# Patient Record
Sex: Male | Born: 1995 | Race: White | Hispanic: No | Marital: Single | Smoking: Never smoker
Health system: Southern US, Community
[De-identification: ages and names within clinical notes are randomized; demographics above are authoritative.]

## PROBLEM LIST (undated history)

## (undated) DIAGNOSIS — F32A Depression, unspecified: Secondary | ICD-10-CM

## (undated) DIAGNOSIS — F419 Anxiety disorder, unspecified: Secondary | ICD-10-CM

## (undated) DIAGNOSIS — F329 Major depressive disorder, single episode, unspecified: Secondary | ICD-10-CM

## (undated) HISTORY — DX: Depression, unspecified: F32.A

## (undated) HISTORY — DX: Anxiety disorder, unspecified: F41.9

---
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
## 2019-01-23 ENCOUNTER — Ambulatory Visit: Payer: Self-pay | Admitting: Family Medicine

## 2019-02-06 ENCOUNTER — Ambulatory Visit: Payer: Self-pay | Admitting: Family Medicine

## 2019-05-11 ENCOUNTER — Other Ambulatory Visit (HOSPITAL_COMMUNITY)
Admission: RE | Admit: 2019-05-11 | Discharge: 2019-05-11 | Disposition: A | Discharge status: Home / Self Care | Payer: Managed Care, Other (non HMO) | Source: Ambulatory Visit | Attending: Family Medicine | Admitting: Family Medicine

## 2019-05-11 ENCOUNTER — Ambulatory Visit (INDEPENDENT_AMBULATORY_CARE_PROVIDER_SITE_OTHER): Payer: Managed Care, Other (non HMO)

## 2019-05-11 ENCOUNTER — Other Ambulatory Visit: Payer: Self-pay

## 2019-05-11 ENCOUNTER — Ambulatory Visit (INDEPENDENT_AMBULATORY_CARE_PROVIDER_SITE_OTHER): Payer: Managed Care, Other (non HMO) | Admitting: Family Medicine

## 2019-05-11 ENCOUNTER — Encounter: Payer: Self-pay | Admitting: Family Medicine

## 2019-05-11 VITALS — BP 119/77 | HR 85 | Temp 98.4°F | Wt 108.6 lb

## 2019-05-11 DIAGNOSIS — Z113 Encounter for screening for infections with a predominantly sexual mode of transmission: Secondary | ICD-10-CM | POA: Diagnosis present

## 2019-05-11 DIAGNOSIS — M25512 Pain in left shoulder: Secondary | ICD-10-CM | POA: Diagnosis not present

## 2019-05-11 DIAGNOSIS — R0789 Other chest pain: Secondary | ICD-10-CM | POA: Diagnosis not present

## 2019-05-11 NOTE — Progress Notes (Signed)
1/4/20219:51 AM  Dylan Briggs [birth year], 24 y.o., male [id number]  Chief Complaint  Patient presents with  . 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 Patient stated he has a hx of anxiety and depression but do not take any meds for this at this time.[id number]    HPI:   Patient is a 24 y.o. 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 body rotation Reports that it feels that at times it is harder to take deep full breathes Denies any palpitations, nausea, vomiting, abd pain Reports weight is stable  He denies STD sx, would like to be screened  Depression screen [facility] 2/9 05/11/2019  Decreased Interest 0  Down, Depressed, Hopeless 3  PHQ - 2 Score 3  Altered sleeping 0  Tired, decreased energy 2  Change in appetite 1  Feeling bad or failure about yourself  0  Trouble concentrating 1  Moving slowly or fidgety/restless 1  Suicidal thoughts 0  PHQ-9 Score 8  Difficult doing work/chores Not difficult at all   GAD 7 : Generalized Anxiety Score 05/11/2019  Nervous, Anxious, on Edge 2  Control/stop worrying 2  Worry too much - different things 2  Trouble relaxing 1  Restless 2  Easily annoyed or irritable 1  Afraid - awful might happen 2  Total GAD 7 Score 12     Fall Risk  05/11/2019  Falls in the past year? 0  Number falls in past yr: 1  Injury with Fall? 0  Follow up Falls evaluation completed     No  Known Allergies  Prior to Admission medications   Not on File    Past Medical History:  Diagnosis Date  . Anxiety   . Depression     History reviewed. No pertinent surgical history.  Social History   Tobacco Use  . Smoking status: Never Smoker  . Smokeless tobacco: Never Used  Substance Use Topics  . Alcohol use: Not Currently    Family History  Problem Relation Age of Onset  . Healthy Mother   . Hypertension Father     ROS Per hpi  OBJECTIVE:  Today's Vitals   05/11/19 0936  BP: 119/77  Pulse: 85  Temp: 98.4 F (36.9 C)  TempSrc: Temporal  SpO2: 100%  Weight: 108 lb 9.6 oz (49.3 kg)   There is no height or weight on file to calculate BMI.   Physical Exam Vitals and nursing note reviewed.  Constitutional:      Appearance: He is well-developed.  HENT:     Head: Normocephalic and atraumatic.  Eyes:     Conjunctiva/sclera: Conjunctivae normal.     Pupils: Pupils are equal, round, and reactive to light.  Cardiovascular:     Rate and Rhythm: Normal rate and regular rhythm.     Heart sounds: No murmur. No friction rub. No gallop.   Pulmonary:     Effort: Pulmonary effort is normal.  Breath sounds: Normal breath sounds. No wheezing, rhonchi or rales.  Chest:     Chest wall: No deformity, tenderness or crepitus.  Musculoskeletal:     Cervical back: Neck supple.     Right lower leg: No edema.     Left lower leg: No edema.  Skin:    General: Skin is warm and dry.  Neurological:     Mental Status: He is alert and oriented to person, place, and time.     No results found for this or any previous visit (from the past 24 hour(s)).  DG Sternum  Result Date: 05/11/2019 CLINICAL DATA:  Sternal pain of unknown origin.  No known injury. EXAM: STERNUM - 2+ VIEW COMPARISON:  None. FINDINGS: There is no evidence of fracture or other focal bone lesions. IMPRESSION: Normal exam. Electronically Signed   By: Inge Rise M.D.   On: 05/11/2019 10:17   DG  Clavicle Left  Result Date: 05/11/2019 CLINICAL DATA:  24 year old male with pain no history of trauma EXAM: LEFT CLAVICLE - 2+ VIEWS COMPARISON:  None. FINDINGS: There is no evidence of fracture or other focal bone lesions. Soft tissues are unremarkable. IMPRESSION: Negative. Electronically Signed   By: Corrie Mckusick D.O.   On: 05/11/2019 10:16     ASSESSMENT and PLAN  1. Screen for STD (sexually transmitted disease) - HIV antibody - RPR - Urine cytology ancillary only  2. Arthralgia of left acromioclavicular joint 3. Sternum pain No obvious deformities on exam or xray. Discussed supportive measures. PT for strength and strengthening given repetitive upper body movements. - DG Clavicle Left; Future - DG Sternum; Future - Ambulatory referral to Physical Therapy  Return if symptoms worsen or fail to improve.    Rutherford Guys, MD [facility] at [place], [place] [address] Ph.  [phone] Fax [phone]

## 2019-05-11 NOTE — Patient Instructions (Signed)
° ° ° °  If you have lab work done today you will be contacted with your lab results within the next 2 weeks.  If you have not heard from us then please contact us. The fastest way to get your results is to register for My Chart. ° ° °IF you received an x-ray today, you will receive an invoice from [facility]. Please contact [facility] at [phone] with questions or concerns regarding your invoice.  ° °IF you received labwork today, you will receive an invoice from [facility]. Please contact [facility] at [phone] with questions or concerns regarding your invoice.  ° °Our billing staff will not be able to assist you with questions regarding bills from these companies. ° °You will be contacted with the lab results as soon as they are available. The fastest way to get your results is to activate your My Chart account. Instructions are located on the last page of this paperwork. If you have not heard from us regarding the results in 2 weeks, please contact this office. °  ° ° ° °

## 2019-05-12 ENCOUNTER — Encounter: Payer: Self-pay | Admitting: Family Medicine

## 2019-05-12 LAB — URINE CYTOLOGY ANCILLARY ONLY
Chlamydia: NEGATIVE
Comment: NEGATIVE
Comment: NEGATIVE
Comment: NORMAL
Neisseria Gonorrhea: NEGATIVE
Trichomonas: NEGATIVE

## 2019-05-12 LAB — RPR: RPR Ser Ql: NONREACTIVE

## 2019-05-12 LAB — HIV ANTIBODY (ROUTINE TESTING W REFLEX): HIV Screen 4th Generation wRfx: NONREACTIVE

## 2019-05-26 ENCOUNTER — Ambulatory Visit: Payer: Managed Care, Other (non HMO) | Admitting: Physical Therapy

## 2019-08-13 ENCOUNTER — Ambulatory Visit: Payer: Managed Care, Other (non HMO) | Attending: Internal Medicine

## 2019-08-13 DIAGNOSIS — Z23 Encounter for immunization: Secondary | ICD-10-CM

## 2019-08-13 NOTE — Progress Notes (Signed)
   Covid-19 Vaccination Clinic  Name:  Dylan Briggs    MRN: [id number] DOB: 1995-08-26  08/13/2019  Mr. [name] was observed post Covid-19 immunization for 15 minutes without incident. He was provided with Vaccine Information Sheet and instruction to access the V-Safe system.   Mr. [name] was instructed to call 911 with any severe reactions post vaccine: Marland Kitchen Difficulty breathing  . Swelling of face and throat  . A fast heartbeat  . A bad rash all over body  . Dizziness and weakness   Immunizations Administered    Name Date Dose VIS Date Route   Pfizer COVID-19 Vaccine 08/13/2019  9:38 AM 0.3 mL 04/17/2019 Intramuscular   Manufacturer: [place]   Lot: [id number]   [place]: [id number]

## 2019-08-15 ENCOUNTER — Ambulatory Visit (INDEPENDENT_AMBULATORY_CARE_PROVIDER_SITE_OTHER)
Admission: RE | Admit: 2019-08-15 | Discharge: 2019-08-15 | Disposition: A | Discharge status: Home / Self Care | Payer: Managed Care, Other (non HMO) | Source: Ambulatory Visit

## 2019-08-15 DIAGNOSIS — L299 Pruritus, unspecified: Secondary | ICD-10-CM | POA: Diagnosis not present

## 2019-08-15 DIAGNOSIS — D229 Melanocytic nevi, unspecified: Secondary | ICD-10-CM

## 2019-08-15 MED ORDER — TRIAMCINOLONE ACETONIDE 0.5 % EX OINT: 1.0000 "application " | TOPICAL_OINTMENT | Freq: Two times a day (BID) | CUTANEOUS | 0 refills | Status: AC

## 2019-08-15 NOTE — ED Provider Notes (Signed)
Virtual Visit via Video Note:  Dylan Briggs  initiated request for Telemedicine visit with [facility] team. I connected with Dylan Briggs  on 08/15/2019 at 9:11 AM  for a synchronized telemedicine visit using a video enabled HIPPA compliant telemedicine application. I verified that I am speaking with Dylan Briggs  using two identifiers. Dylan Briggs, Dylan Briggs  was physically located in a [place] [facility] site and Dylan Briggs was located at a different location.   The limitations of evaluation and management by telemedicine as well as the availability of in-person appointments were discussed. Patient was informed that he  may incur a bill ( including co-pay) for this virtual visit encounter. Dylan Briggs  expressed understanding and gave verbal consent to proceed with virtual visit.     History of Present Illness:Dylan Briggs  is a 24 y.o. male presents with concern of mole on the back of his right calf.  Patient states has been there for a long time, though has been itching for the last 3 weeks.  Developed white spots yesterday.  No openings, discharge, pain, redness.  Patient does endorse subjective fever, chills, aches, though got Covid vaccine Thursday.  Not tried thing for this.  Denies personal or family history of skin malignancy.     ROI as per HPI  Past Medical History:  Diagnosis Date  . Anxiety   . Depression     No Known Allergies      Observations/Objective: 24 year old male Sitting in no acute distress.  Patient is able to speak in full sentences without coughing, sneezing, wheezing.  Patient able to show lesion: Approximately 1 cm nevi with few flat, white spots.  Overall poorly visualized due to camera quality.  Patient denies tenderness.  No appreciable erythema, streaking, discharge.  Assessment and Plan: We will trial triamcinolone for pruritus, patient follow-up with dermatology for further evaluation.  Return precautions discussed, patient  verbalized understanding and is agreeable to plan.  Follow Up Instructions: Patient to follow-up with dermatology.   I discussed the assessment and treatment plan with the patient. The patient was provided an opportunity to ask questions and all were answered. The patient agreed with the plan and demonstrated an understanding of the instructions.   The patient was advised to call back or seek an in-person evaluation if the symptoms worsen or if the condition fails to improve as anticipated.  I provided 10 minutes of non-face-to-face time during this encounter.    [place], Dylan Briggs  08/15/2019 9:11 AM        Briggs, Dylan Briggs, Dylan Briggs 08/15/19 [phone]

## 2019-08-15 NOTE — Discharge Instructions (Addendum)
Apply triamcinolone twice daily x1 week. Important to follow-up with dermatology for further evaluation. Seek in-person evaluation sooner if you develop redness, pain, discharge.

## 2019-09-01 ENCOUNTER — Ambulatory Visit: Payer: Managed Care, Other (non HMO) | Admitting: Family Medicine

## 2019-09-07 ENCOUNTER — Ambulatory Visit: Payer: Managed Care, Other (non HMO) | Attending: Internal Medicine

## 2019-09-07 DIAGNOSIS — Z23 Encounter for immunization: Secondary | ICD-10-CM

## 2019-09-07 NOTE — Progress Notes (Signed)
   Covid-19 Vaccination Clinic  Name:  Dylan Briggs    MRN: [id number] DOB: 1995/06/01  09/07/2019  Dylan Briggs was observed post Covid-19 immunization for 15 minutes without incident. He was provided with Vaccine Information Sheet and instruction to access the V-Safe system.   Dylan Briggs was instructed to call 911 with any severe reactions post vaccine: Marland Kitchen Difficulty breathing  . Swelling of face and throat  . A fast heartbeat  . A bad rash all over body  . Dizziness and weakness   Immunizations Administered    Name Date Dose VIS Date Route   Pfizer COVID-19 Vaccine 09/07/2019  9:38 AM 0.3 mL 07/01/2018 Intramuscular   Manufacturer: [place]   Lot: [id number]   [place]: [id number]

## 2019-10-14 ENCOUNTER — Other Ambulatory Visit: Payer: Self-pay

## 2019-10-14 ENCOUNTER — Ambulatory Visit (HOSPITAL_COMMUNITY)
Admission: EM | Admit: 2019-10-14 | Discharge: 2019-10-14 | Disposition: A | Discharge status: Home / Self Care | Payer: Managed Care, Other (non HMO)

## 2020-10-19 IMAGING — DX DG STERNUM 2+V
2 series · 2 of 2 positions shown · non-contrast
Comparison: None.

CLINICAL DATA: Sternal pain of unknown origin.  No known injury.

EXAM:
STERNUM - 2+ VIEW

[sternum obl]
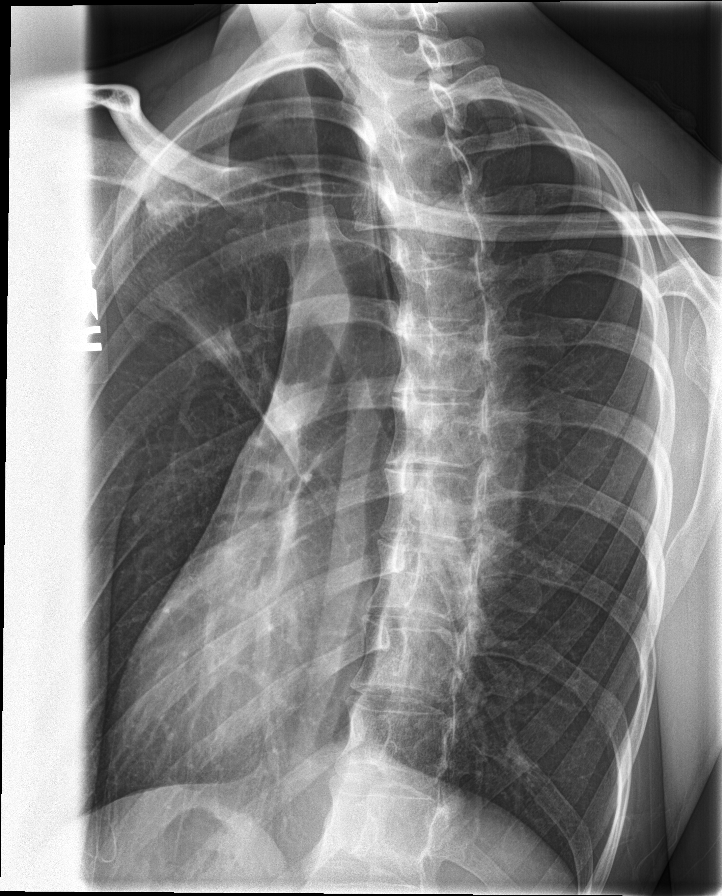

[chest lat]
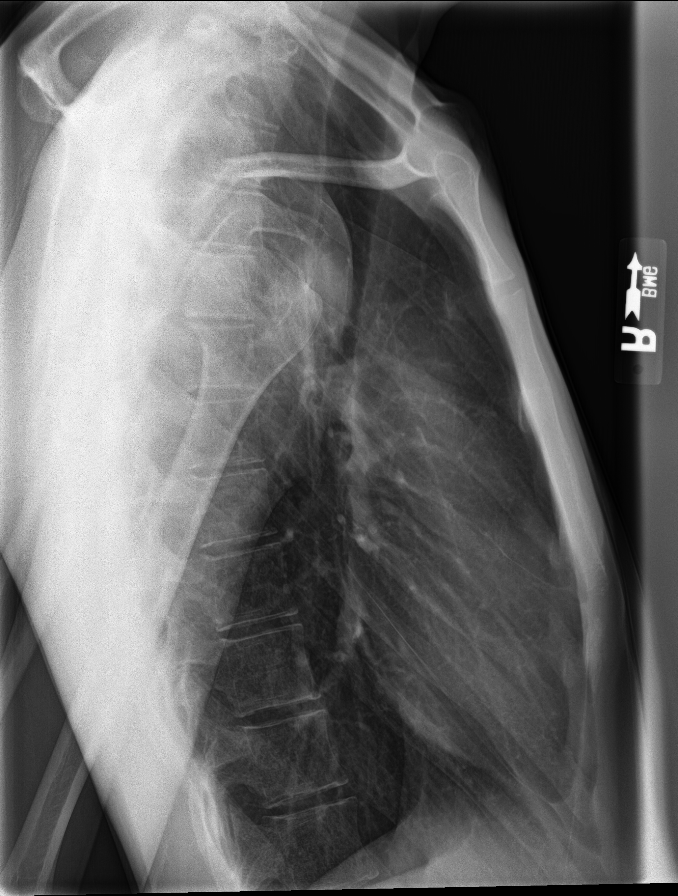

[2 of 2 positions shown; findings below may reference images not displayed]

FINDINGS: There is no evidence of fracture or other focal bone lesions.
IMPRESSION: Normal exam.

## 2020-10-19 IMAGING — DX DG CLAVICLE*L*
2 series · 2 of 2 positions shown · non-contrast
Comparison: None.

CLINICAL DATA: 23-year-old male with pain no history of trauma

EXAM:
LEFT CLAVICLE - 2+ VIEWS

[clavicle ap]
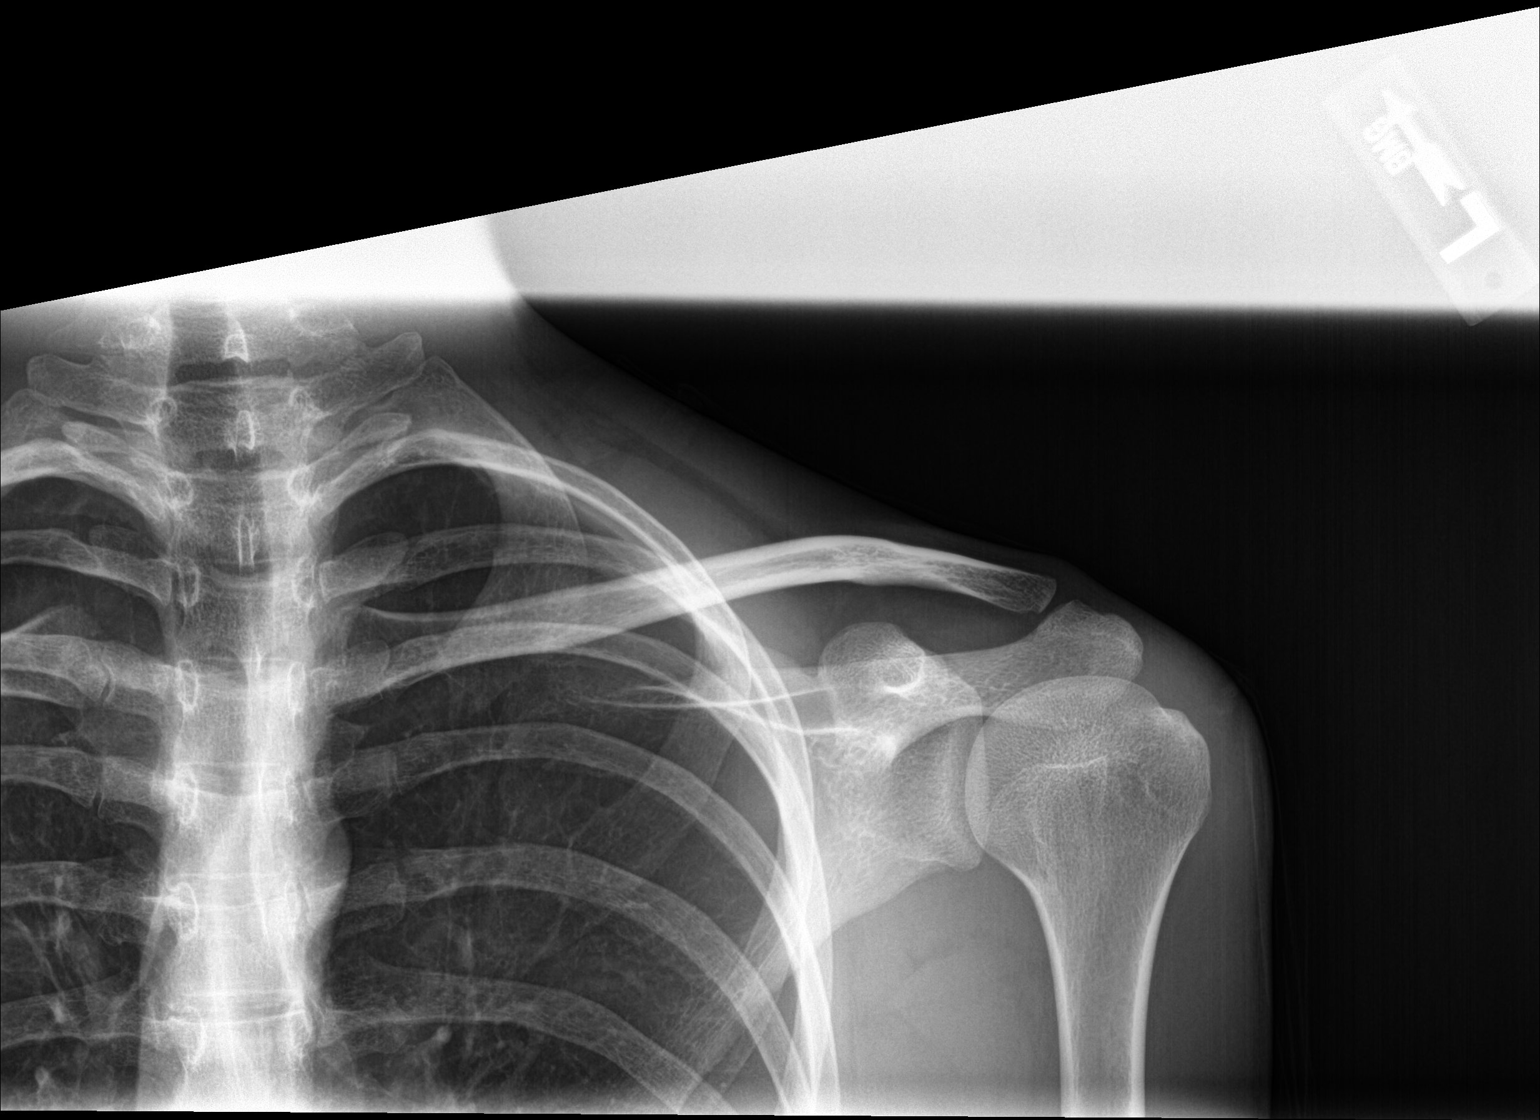

[clavicle axial]
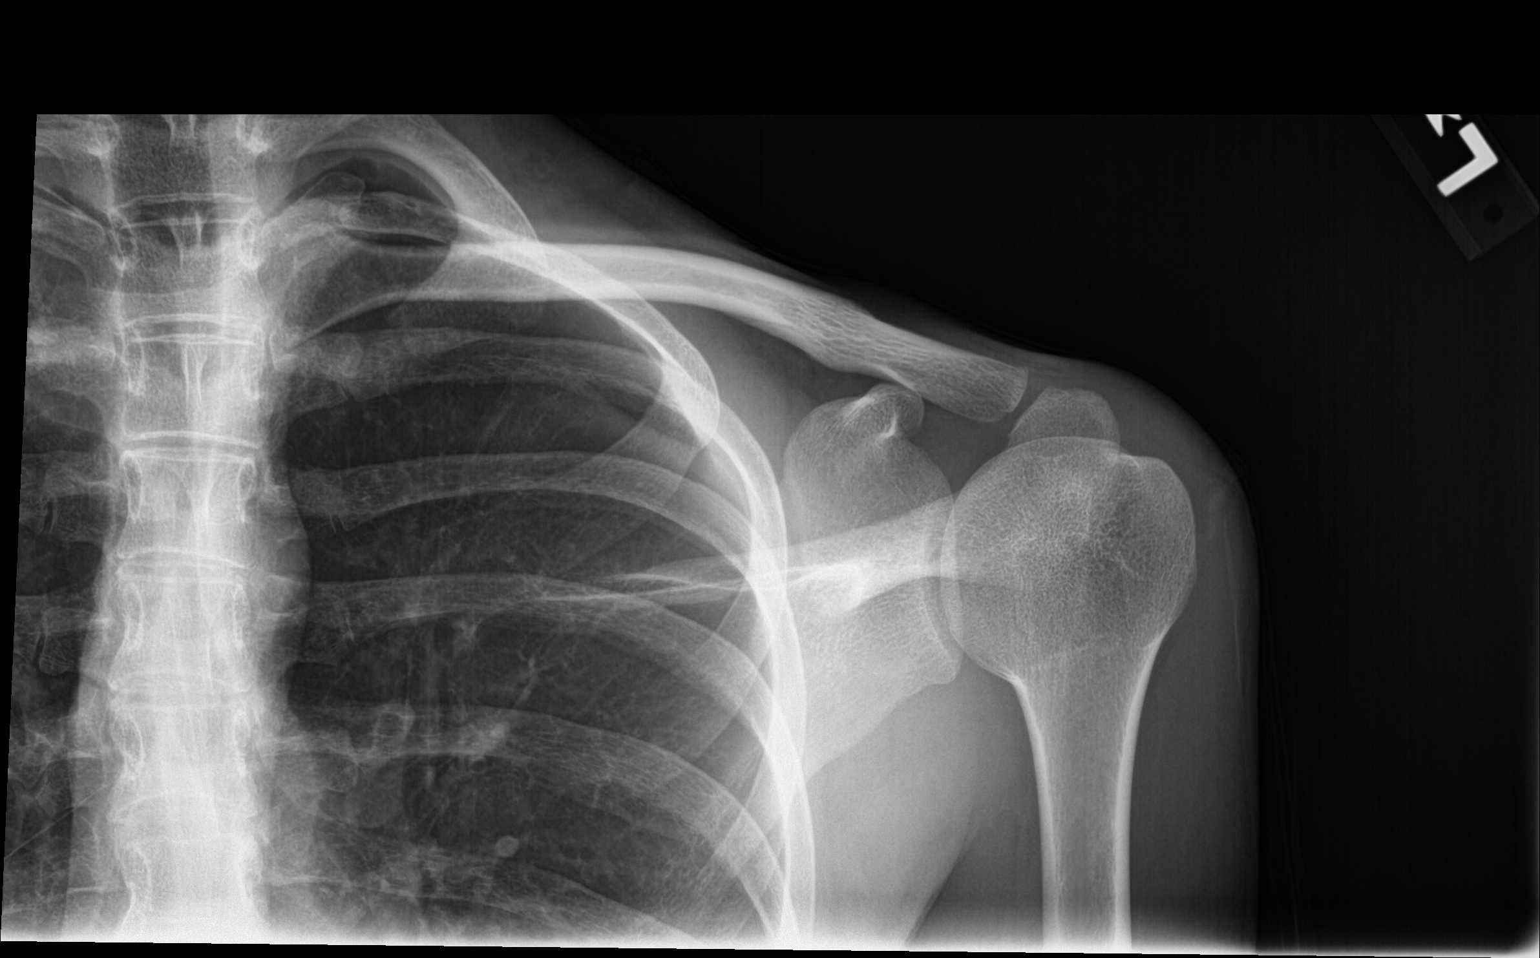

[2 of 2 positions shown; findings below may reference images not displayed]

FINDINGS: There is no evidence of fracture or other focal bone lesions. Soft
tissues are unremarkable.
IMPRESSION: Negative.

## 2021-03-31 ENCOUNTER — Other Ambulatory Visit: Payer: Self-pay

## 2021-03-31 ENCOUNTER — Ambulatory Visit
Admission: EM | Admit: 2021-03-31 | Discharge: 2021-03-31 | Disposition: A | Discharge status: Home / Self Care | Payer: Managed Care, Other (non HMO) | Attending: Physician Assistant | Admitting: Physician Assistant

## 2021-03-31 ENCOUNTER — Encounter: Payer: Self-pay | Admitting: Emergency Medicine

## 2021-03-31 DIAGNOSIS — J101 Influenza due to other identified influenza virus with other respiratory manifestations: Secondary | ICD-10-CM | POA: Diagnosis not present

## 2021-03-31 LAB — POCT INFLUENZA A/B
Influenza A, POC: POSITIVE — AB
Influenza B, POC: NEGATIVE

## 2021-03-31 MED ORDER — OSELTAMIVIR PHOSPHATE 75 MG PO CAPS: 75.0000 mg | ORAL_CAPSULE | Freq: Two times a day (BID) | ORAL | 0 refills | Status: AC

## 2021-03-31 NOTE — ED Triage Notes (Signed)
Sunday began intermittent fever, then developed body aches, nausea, cough, nasal congestion

## 2021-03-31 NOTE — ED Provider Notes (Signed)
[facility]    CSN: [id number] Arrival date & time: 03/31/21  0825      History   Chief Complaint Chief Complaint  Patient presents with   Fever   Cough    HPI Dylan Briggs is a 25 y.o. male.   Patient here today for evaluation of fever, body aches, nausea, nasal congestion and cough that started about 4 days ago.  He reports that he has had some sore throat.  He does not report vomiting or diarrhea.  He has tried over-the-counter treatment with mild relief.  The history is provided by the patient.  Fever Associated symptoms: cough, myalgias, nausea and sore throat   Associated symptoms: no diarrhea and no vomiting   Cough Associated symptoms: fever, myalgias and sore throat   Associated symptoms: no shortness of breath    Past Medical History:  Diagnosis Date   Anxiety    Depression     There are no problems to display for this patient.   History reviewed. No pertinent surgical history.     Home Medications    Prior to Admission medications   Medication Sig Start Date End Date Taking? Authorizing Provider  oseltamivir (TAMIFLU) 75 MG capsule Take 1 capsule (75 mg total) by mouth every 12 (twelve) hours. 03/31/21  Yes Francene Finders, PA-C  triamcinolone ointment (KENALOG) 0.5 % Apply 1 application topically 2 (two) times daily. 08/15/19   Hall-Potvin, [name], PA-C    Family History Family History  Problem Relation Age of Onset   Healthy Mother    Hypertension Father     Social History Social History   Tobacco Use   Smoking status: Never   Smokeless tobacco: Never  Substance Use Topics   Alcohol use: Not Currently   Drug use: Not Currently    Types: Marijuana     Allergies   Patient has no known allergies.   Review of Systems Review of Systems  Constitutional:  Positive for fever.  HENT:  Positive for sore throat.   Respiratory:  Positive for cough. Negative for shortness of breath.   Gastrointestinal:  Positive for  nausea. Negative for diarrhea and vomiting.  Musculoskeletal:  Positive for myalgias.    Physical Exam Triage Vital Signs ED Triage Vitals  Enc Vitals Group     BP 03/31/21 0935 131/79     Pulse Rate 03/31/21 0935 (!) 107     Resp 03/31/21 0935 16     Temp 03/31/21 0935 98.8 F (37.1 C)     Temp Source 03/31/21 0935 Oral     SpO2 03/31/21 0935 98 %     Weight --      Height --      Head Circumference --      Peak Flow --      Pain Score 03/31/21 0936 5     Pain Loc --      Pain Edu? --      Excl. in [place]? --    No data found.  Updated Vital Signs BP 131/79 (BP Location: Right Arm)   Pulse (!) 107   Temp 98.8 F (37.1 C) (Oral)   Resp 16   SpO2 98%     Physical Exam Vitals and nursing note reviewed.  Constitutional:      General: He is not in acute distress.    Appearance: Normal appearance. He is not ill-appearing.  HENT:     Head: Normocephalic and atraumatic.     Right Ear: Tympanic  membrane normal.     Left Ear: Tympanic membrane normal.     Nose: Congestion present.     Mouth/Throat:     Mouth: Mucous membranes are moist.     Pharynx: Oropharynx is clear. No oropharyngeal exudate or posterior oropharyngeal erythema.  Eyes:     Conjunctiva/sclera: Conjunctivae normal.  Cardiovascular:     Rate and Rhythm: Normal rate and regular rhythm.     Heart sounds: Normal heart sounds. No murmur heard. Pulmonary:     Effort: Pulmonary effort is normal. No respiratory distress.     Breath sounds: Normal breath sounds. No wheezing, rhonchi or rales.  Skin:    General: Skin is warm and dry.  Neurological:     Mental Status: He is alert.  Psychiatric:        Mood and Affect: Mood normal.        Thought Content: Thought content normal.     UC Treatments / Results  Labs (all labs ordered are listed, but only abnormal results are displayed) Labs Reviewed  POCT INFLUENZA A/B - Abnormal; Notable for the following components:      Result Value   Influenza A, POC  Positive (*)    All other components within normal limits    EKG   Radiology No results found.  Procedures Procedures (including critical care time)  Medications Ordered in UC Medications - No data to display  Initial Impression / Assessment and Plan / UC Course  I have reviewed the triage vital signs and the nursing notes.  Pertinent labs & imaging results that were available during my care of the patient were reviewed by me and considered in my medical decision making (see chart for details).  Test positive.  Will treat with Tamiflu.  Recommend symptomatic treatment otherwise.  Encouraged follow-up if symptoms fail to improve or worsen.  Final Clinical Impressions(s) / UC Diagnoses   Final diagnoses:  Influenza A   Discharge Instructions   None    ED Prescriptions     Medication Sig Dispense Auth. Provider   oseltamivir (TAMIFLU) 75 MG capsule Take 1 capsule (75 mg total) by mouth every 12 (twelve) hours. 10 capsule Francene Finders, PA-C      PDMP not reviewed this encounter.   Francene Finders, PA-C 03/31/21 1054

## 2022-10-15 ENCOUNTER — Emergency Department (HOSPITAL_COMMUNITY): Payer: Managed Care, Other (non HMO)

## 2022-10-15 ENCOUNTER — Emergency Department (HOSPITAL_COMMUNITY)
Admission: EM | Admit: 2022-10-15 | Discharge: 2022-10-16 | Disposition: A | Discharge status: Home / Self Care | Payer: Managed Care, Other (non HMO) | Attending: Emergency Medicine | Admitting: Emergency Medicine

## 2022-10-15 ENCOUNTER — Encounter (HOSPITAL_COMMUNITY): Payer: Self-pay

## 2022-10-15 ENCOUNTER — Other Ambulatory Visit: Payer: Self-pay

## 2022-10-15 DIAGNOSIS — N200 Calculus of kidney: Secondary | ICD-10-CM | POA: Diagnosis not present

## 2022-10-15 DIAGNOSIS — R109 Unspecified abdominal pain: Secondary | ICD-10-CM | POA: Diagnosis present

## 2022-10-15 LAB — URINALYSIS, ROUTINE W REFLEX MICROSCOPIC
Bacteria, UA: NONE SEEN
Bilirubin Urine: NEGATIVE
Glucose, UA: NEGATIVE mg/dL
Ketones, ur: NEGATIVE mg/dL
Leukocytes,Ua: NEGATIVE
Nitrite: NEGATIVE
Protein, ur: NEGATIVE mg/dL
Specific Gravity, Urine: 1.012 (ref 1.005–1.030)
pH: 6 (ref 5.0–8.0)

## 2022-10-15 MED ORDER — KETOROLAC TROMETHAMINE 60 MG/2ML IM SOLN
30.0000 mg | Freq: Once | INTRAMUSCULAR | Status: AC
  Administered 2022-10-15: 30 mg via INTRAMUSCULAR
  Filled 2022-10-15: qty 2

## 2022-10-15 NOTE — ED Provider Notes (Signed)
[facility] EMERGENCY DEPARTMENT AT [facility] Provider Note  CSN: [id number] Arrival date & time: 10/15/22 1930  Chief Complaint(s) Flank Pain  HPI Dylan Briggs is a 27 y.o. male with a past medical history listed below who presents to the emergency department with approximately 1 week of right-sided flank pain, intermittent and fluctuating in nature.  Had severe pain on Saturday and he took 800 mg of ibuprofen.  That did provide some relief.  Send he reports feeling much better however today the pain returned and he has been having persistent flank pain rating to his groin.  Patient believes he has a renal stone but has no prior history of such.  He does report frequency without dysuria.  He reported associated nausea and vomiting due to the severity of the pain.  Denies any current nausea at this time.  He denies any other physical complaints.  The history is provided by the patient.    Past Medical History Past Medical History:  Diagnosis Date   Anxiety    Depression    There are no problems to display for this patient.  Home Medication(s) Prior to Admission medications   Medication Sig Start Date End Date Taking? Authorizing Provider  oseltamivir (TAMIFLU) 75 MG capsule Take 1 capsule (75 mg total) by mouth every 12 (twelve) hours. 03/31/21   Tomi Bamberger, PA-C  triamcinolone ointment (KENALOG) 0.5 % Apply 1 application topically 2 (two) times daily. 08/15/19   Hall-Potvin, [place], PA-C                                                                                                                                    Allergies Amoxicillin  Review of Systems Review of Systems As noted in HPI  Physical Exam Vital Signs  I have reviewed the triage vital signs BP 135/82   Pulse 77   Temp 98.6 F (37 C) (Oral)   Resp 17   Ht 5\' 7"  (1.702 m)   Wt 49 kg   SpO2 100%   BMI 16.92 kg/m   Physical Exam Vitals reviewed.  Constitutional:      General: He is  not in acute distress.    Appearance: He is well-developed. He is not diaphoretic.  HENT:     Head: Normocephalic and atraumatic.     Right Ear: External ear normal.     Left Ear: External ear normal.     Nose: Nose normal.     Mouth/Throat:     Mouth: Mucous membranes are moist.  Eyes:     General: No scleral icterus.    Conjunctiva/sclera: Conjunctivae normal.  Neck:     Trachea: Phonation normal.  Cardiovascular:     Rate and Rhythm: Normal rate and regular rhythm.  Pulmonary:     Effort: Pulmonary effort is normal. No respiratory distress.     Breath sounds: No stridor.  Abdominal:  General: There is no distension.     Tenderness: There is no abdominal tenderness. There is right CVA tenderness.  Musculoskeletal:        General: Normal range of motion.     Cervical back: Normal range of motion.  Neurological:     Mental Status: He is alert and oriented to person, place, and time.  Psychiatric:        Behavior: Behavior normal.     ED Results and Treatments Labs (all labs ordered are listed, but only abnormal results are displayed) Labs Reviewed  URINALYSIS, ROUTINE W REFLEX MICROSCOPIC - Abnormal; Notable for the following components:      Result Value   Color, Urine STRAW (*)    Hgb urine dipstick MODERATE (*)    All other components within normal limits                                                                                                                         EKG  EKG Interpretation  Date/Time:    Ventricular Rate:    PR Interval:    QRS Duration:   QT Interval:    QTC Calculation:   R Axis:     Text Interpretation:         Radiology No results found.  Medications Ordered in ED Medications  ketorolac (TORADOL) injection 30 mg (has no administration in time range)   Procedures Procedures  (including critical care time) Medical Decision Making / ED Course   Medical Decision Making Amount and/or Complexity of Data Reviewed Labs:  ordered. Decision-making details documented in ED Course. Radiology: ordered and independent interpretation performed. Decision-making details documented in ED Course.  Risk Prescription drug management.    Right flank pain Presentation is most suspicious for renal colic.  Will rule out UTI.  Additionally could be possible muscle strain/spasm.  I have low suspicion for acute aortic process.  UA with hematuria.  No evidence of infection.  IM toradol given.     Final Clinical Impression(s) / ED Diagnoses Final diagnoses:  None    This chart was dictated using voice recognition software.  Despite best efforts to proofread,  errors can occur which can change the documentation meaning.

## 2022-10-15 NOTE — ED Triage Notes (Signed)
Pt. Arrives POV with partner c/o flank pain that radiates to the abdomen since Saturday. Pt. States he thinks he has a kidney stone, but does not know since he has not had one before. Pt. States that on Saturday he experienced nausea and vomiting. That has stopped. He has been taking ibuprofen for pain. Last dose was this morning at 11.

## 2022-10-16 NOTE — Discharge Instructions (Addendum)
During the workup we noted incidental findings on your imaging that would require you to follow-up with your regular doctor for further evaluation/management: Subcentimeter mesenteric lymph nodes within the mid and lower   right abdomen, which may represent mild mesenteric adenitis.
